# Patient Record
Sex: Female | Born: 1945 | Race: White | Hispanic: No | Marital: Married
Health system: Southern US, Community
[De-identification: ages and names within clinical notes are randomized; demographics above are authoritative.]

## PROBLEM LIST (undated history)

## (undated) DIAGNOSIS — E7800 Pure hypercholesterolemia, unspecified: Secondary | ICD-10-CM

## (undated) DIAGNOSIS — I10 Essential (primary) hypertension: Secondary | ICD-10-CM

---

## 1999-05-24 ENCOUNTER — Other Ambulatory Visit: Admission: RE | Admit: 1999-05-24 | Discharge: 1999-05-24 | Payer: Self-pay | Admitting: Family Medicine

## 1999-10-13 ENCOUNTER — Other Ambulatory Visit: Admission: RE | Admit: 1999-10-13 | Discharge: 1999-10-13 | Payer: Self-pay | Admitting: Internal Medicine

## 1999-12-26 ENCOUNTER — Encounter: Admission: RE | Admit: 1999-12-26 | Discharge: 2000-01-04 | Payer: Self-pay | Admitting: Family Medicine

## 2000-01-10 ENCOUNTER — Encounter: Payer: Self-pay | Admitting: Family Medicine

## 2000-01-10 ENCOUNTER — Encounter: Admission: RE | Admit: 2000-01-10 | Discharge: 2000-01-10 | Payer: Self-pay | Admitting: Family Medicine

## 2000-01-17 ENCOUNTER — Encounter: Admission: RE | Admit: 2000-01-17 | Discharge: 2000-01-17 | Payer: Self-pay | Admitting: Family Medicine

## 2000-01-17 ENCOUNTER — Encounter: Payer: Self-pay | Admitting: Family Medicine

## 2000-06-21 ENCOUNTER — Other Ambulatory Visit: Admission: RE | Admit: 2000-06-21 | Discharge: 2000-06-21 | Payer: Self-pay | Admitting: Family Medicine

## 2000-11-27 ENCOUNTER — Encounter: Payer: Self-pay | Admitting: Family Medicine

## 2000-11-27 ENCOUNTER — Encounter: Admission: RE | Admit: 2000-11-27 | Discharge: 2000-11-27 | Payer: Self-pay | Admitting: Family Medicine

## 2001-09-20 ENCOUNTER — Other Ambulatory Visit: Admission: RE | Admit: 2001-09-20 | Discharge: 2001-09-20 | Payer: Self-pay | Admitting: Family Medicine

## 2001-11-07 ENCOUNTER — Observation Stay (HOSPITAL_COMMUNITY): Admission: RE | Admit: 2001-11-07 | Discharge: 2001-11-07 | Payer: Self-pay | Admitting: Surgery

## 2001-11-07 ENCOUNTER — Encounter: Payer: Self-pay | Admitting: Surgery

## 2003-01-14 ENCOUNTER — Other Ambulatory Visit: Admission: RE | Admit: 2003-01-14 | Discharge: 2003-01-14 | Payer: Self-pay | Admitting: Gynecology

## 2003-07-21 ENCOUNTER — Encounter: Payer: Self-pay | Admitting: Family Medicine

## 2003-07-21 ENCOUNTER — Encounter: Admission: RE | Admit: 2003-07-21 | Discharge: 2003-07-21 | Payer: Self-pay | Admitting: Family Medicine

## 2004-02-17 ENCOUNTER — Emergency Department (HOSPITAL_COMMUNITY): Admission: EM | Admit: 2004-02-17 | Discharge: 2004-02-17 | Payer: Self-pay | Admitting: Emergency Medicine

## 2004-04-22 ENCOUNTER — Other Ambulatory Visit: Admission: RE | Admit: 2004-04-22 | Discharge: 2004-04-22 | Payer: Self-pay | Admitting: Family Medicine

## 2004-09-08 IMAGING — CT CT PELVIS W/O CM
1 series · 15 of 32 positions shown, 19 images · non-contrast
Comparison: none

CLINICAL DATA: Hematuria, nausea, abdominal pain.  Question kidney stones. 
 CT ABDOMEN AND PELVIS WITHOUT CONTRAST   
 Multidetector helical CT imaging abdomen and pelvis performed using kidney stone protocol.  Neither oral nor intravenous contrast utilized for this indication.

[Series 2: abd pelvis · axial · 0.70mm/px · z∈[-465,-155]mm · 15 of 70 slices shown, 19 images]
[im 5/70  soft-tissue]
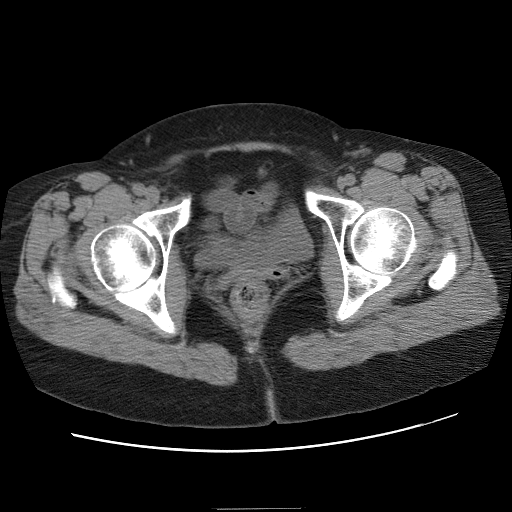
[im 5/70  bone]
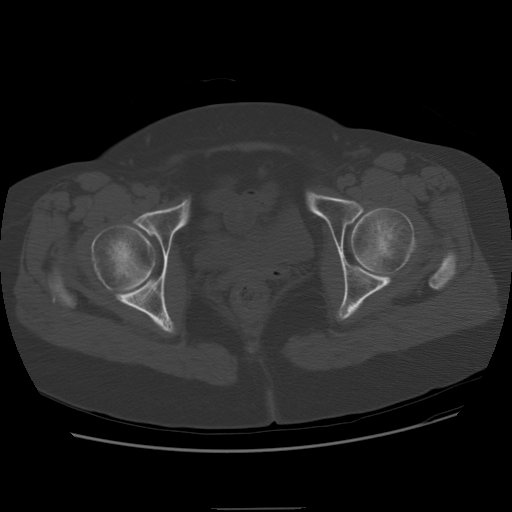
[im 9/70  soft-tissue]
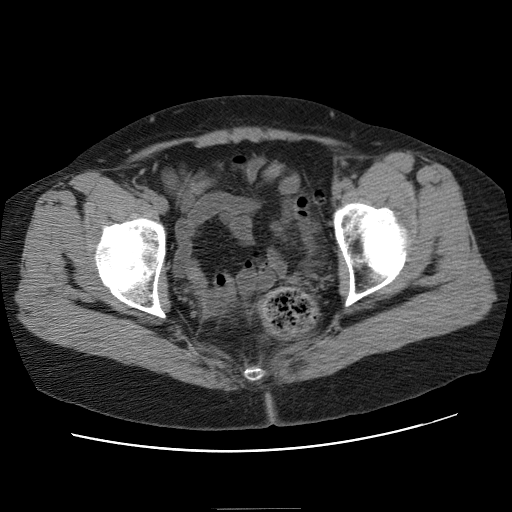
[im 14/70  soft-tissue]
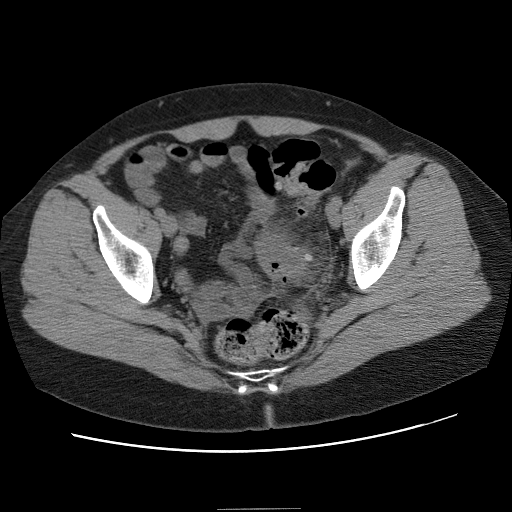
[im 21/70  soft-tissue]
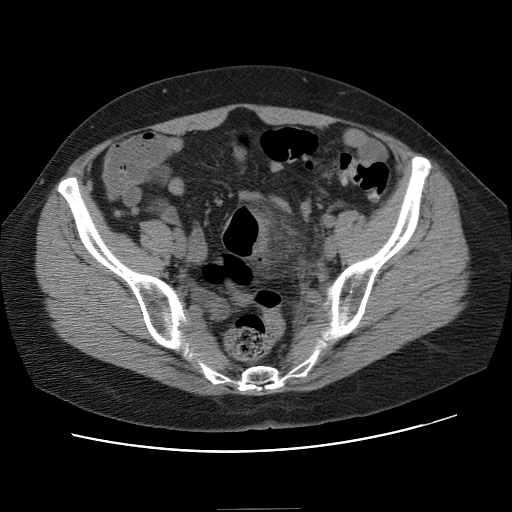
[im 25/70  soft-tissue]
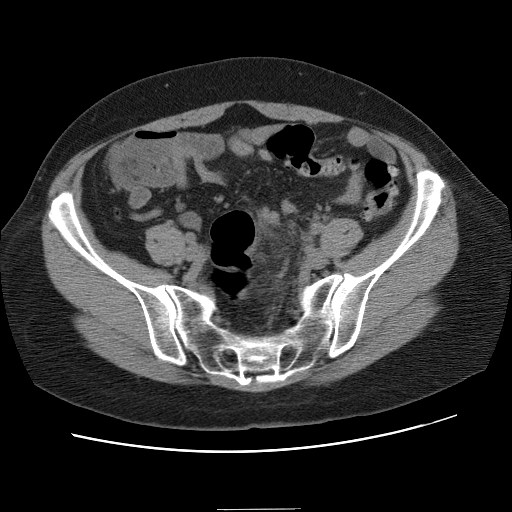
[im 29/70  soft-tissue]
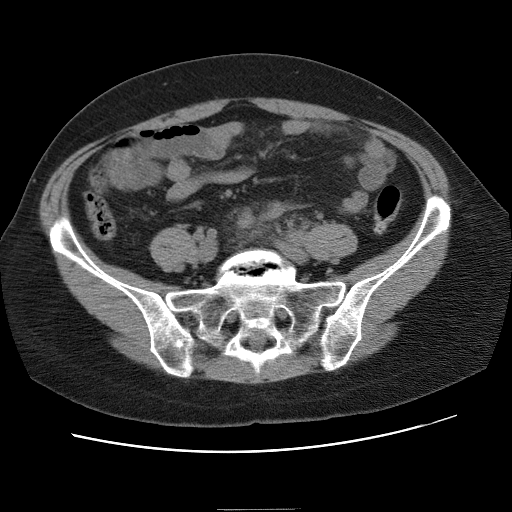
[im 36/70  soft-tissue]
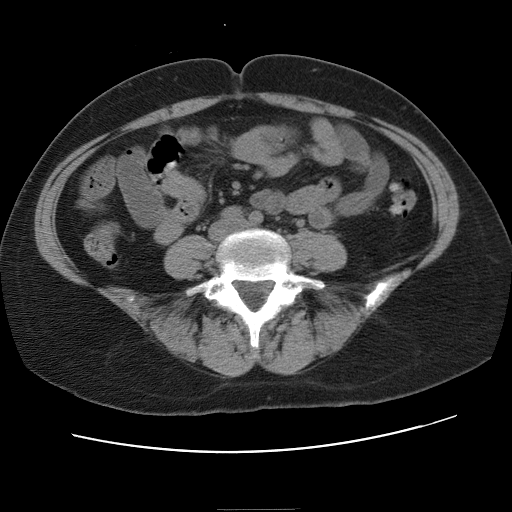
[im 41/70  soft-tissue]
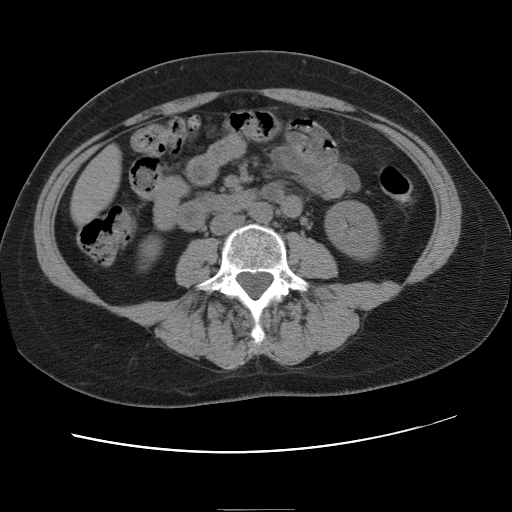
[im 45/70  soft-tissue]
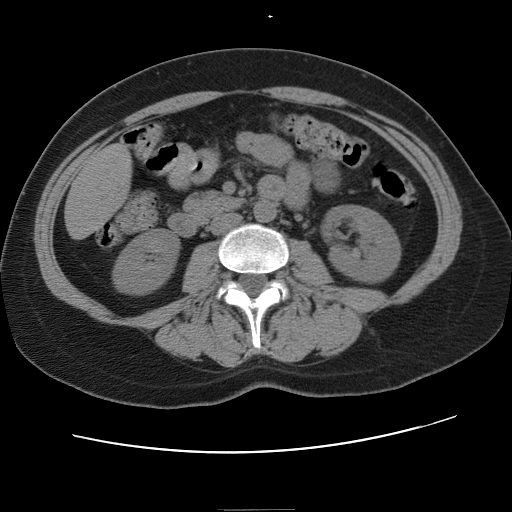
[im 45/70  bone]
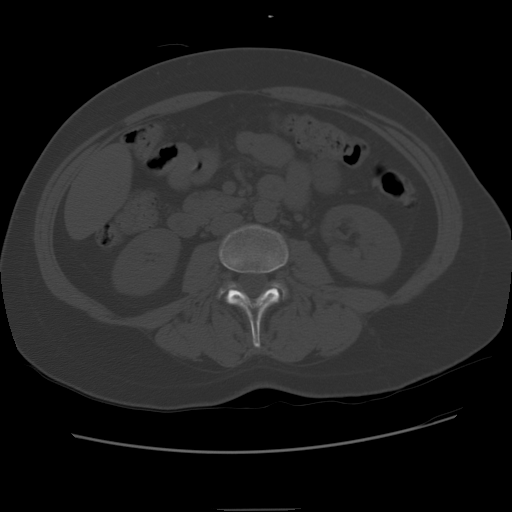
[im 49/70  soft-tissue]
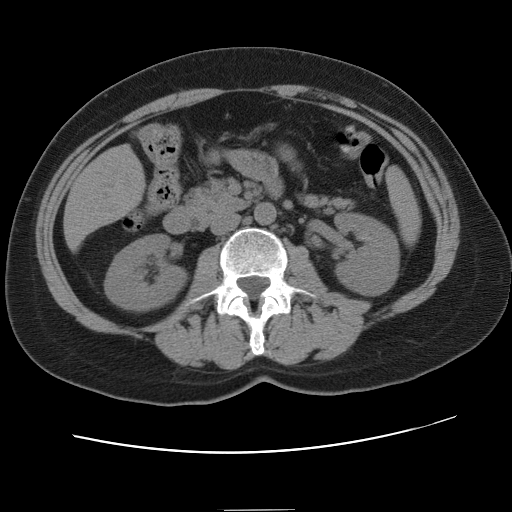
[im 56/70  soft-tissue]
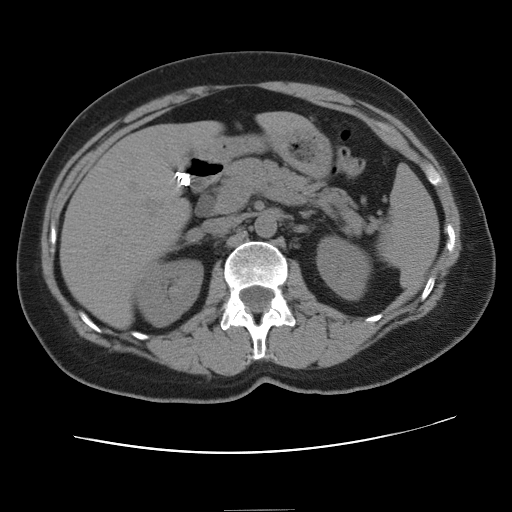
[im 61/70  soft-tissue]
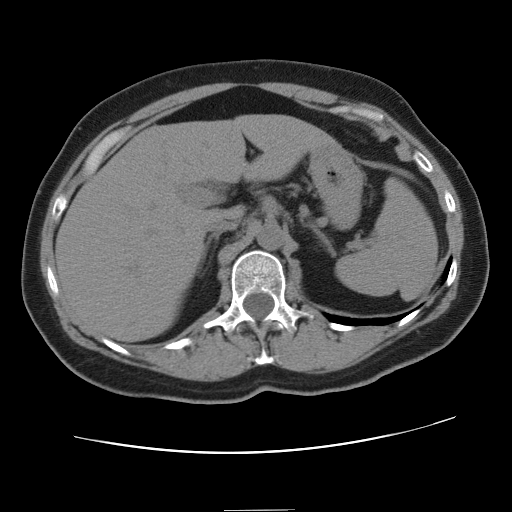
[im 61/70  lung]
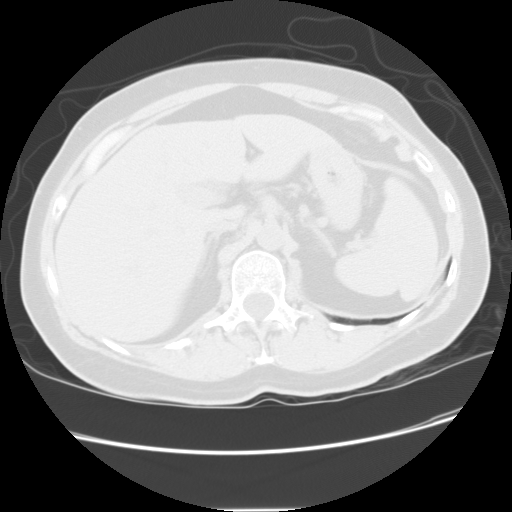
[im 63/70  lung]
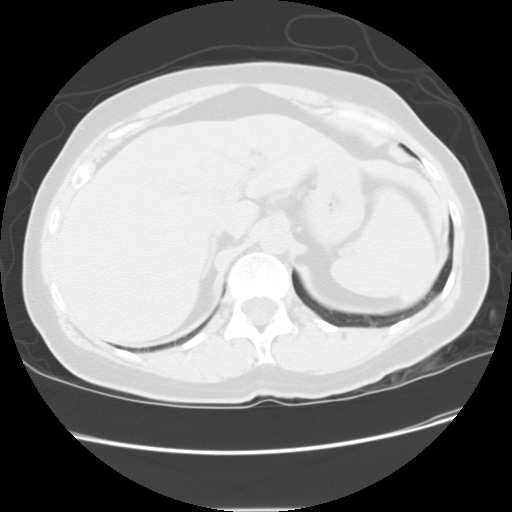
[im 65/70  soft-tissue]
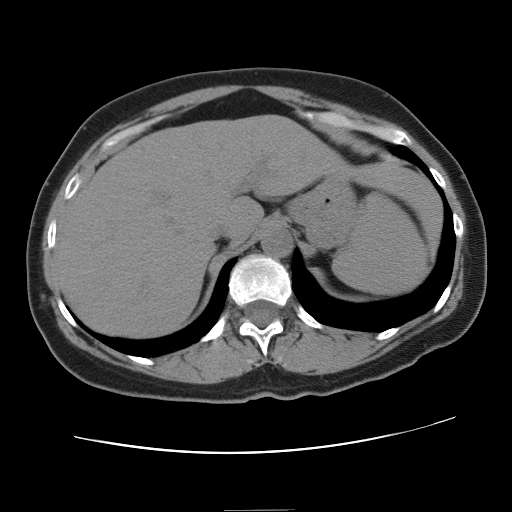
[im 65/70  lung]
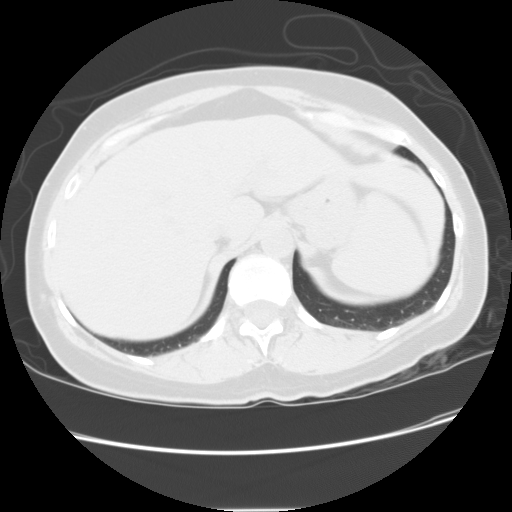
[im 67/70  lung]
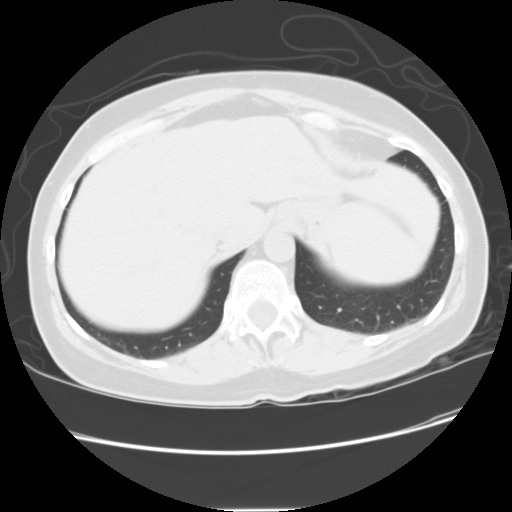

[15 of 32 positions shown; findings below may reference images not displayed]

FINDINGS: Status-post cholecystectomy.  Elongated lateral segment left lobe liver extending to left lateral abdominal wall lateral to spleen.  No hydronephrosis or renal calcification.  Low attenuation focus midright kidney, 1.4 x 1.3 cm diameter (image 18) question cyst.  Remaining solid organs and bowel loops unremarkable for exam lacking IV and oral contrast.  No mass, adenopathy, free fluid, or inflammatory process seen. 
 IMPRESSION
 No acute abnormalities.  Question right renal cyst.  This can be confirmed by ultrasound.
 CT PELVIS 
 Scattered sigmoid diverticula with segmental area of wall thickening and surrounding paracolic inflammatory changes compatible with diverticulitis.  Small amount of free pelvic fluid is seen, cannot exclude infection of this fluid.  No free intraperitoneal air identified.  No signs of bowel obstruction.  No hernia.  
 IMPRESSION
 Sigmoid diverticulitis.  Small amount of free pelvic fluid, cannot exclude infection of this fluid by computed tomography.

## 2011-04-04 ENCOUNTER — Encounter: Payer: Self-pay | Admitting: Internal Medicine

## 2014-01-29 ENCOUNTER — Encounter: Payer: Self-pay | Admitting: Internal Medicine

## 2022-01-27 ENCOUNTER — Encounter (HOSPITAL_COMMUNITY): Payer: Self-pay | Admitting: Emergency Medicine

## 2022-01-27 ENCOUNTER — Ambulatory Visit (INDEPENDENT_AMBULATORY_CARE_PROVIDER_SITE_OTHER): Payer: Medicare Other

## 2022-01-27 ENCOUNTER — Ambulatory Visit (HOSPITAL_COMMUNITY)
Admission: EM | Admit: 2022-01-27 | Discharge: 2022-01-27 | Disposition: A | Discharge status: Home / Self Care | Payer: Medicare Other | Attending: Family Medicine | Admitting: Family Medicine

## 2022-01-27 DIAGNOSIS — S52501A Unspecified fracture of the lower end of right radius, initial encounter for closed fracture: Secondary | ICD-10-CM

## 2022-01-27 DIAGNOSIS — M25531 Pain in right wrist: Secondary | ICD-10-CM

## 2022-01-27 DIAGNOSIS — W19XXXA Unspecified fall, initial encounter: Secondary | ICD-10-CM

## 2022-01-27 HISTORY — DX: Essential (primary) hypertension: I10

## 2022-01-27 HISTORY — DX: Pure hypercholesterolemia, unspecified: E78.00

## 2022-01-27 MED ORDER — IBUPROFEN 800 MG PO TABS: 800.0000 mg | ORAL_TABLET | Freq: Three times a day (TID) | ORAL | 0 refills | Status: AC | PRN

## 2022-01-27 MED ORDER — KETOROLAC TROMETHAMINE 30 MG/ML IJ SOLN
INTRAMUSCULAR | Status: AC
  Filled 2022-01-27: qty 1

## 2022-01-27 MED ORDER — KETOROLAC TROMETHAMINE 30 MG/ML IJ SOLN
30.0000 mg | Freq: Once | INTRAMUSCULAR | Status: AC
  Administered 2022-01-27: 30 mg via INTRAMUSCULAR

## 2022-01-27 NOTE — Discharge Instructions (Addendum)
There is most likely a small fracture of your radius bone.  Continue to wear your splint most the time. ? ?Have been given a shot of Toradol 30 mg here in the office ? ?Take ibuprofen 800 mg 1 every 8 hours as needed for pain ?

## 2022-01-27 NOTE — ED Triage Notes (Signed)
Pt fell off a curb Wed night. C/o right wrist pain. Pt came in wearing a brace purchased to help with stability and pain. Pt has bruising to right cheek from where hit face as well. Pt denies LOC or taking blood thinners but reports "been little foggy since".  ?

## 2022-01-27 NOTE — ED Provider Notes (Signed)
?[facility] ? ? ? ?CSN: [id number] ?Arrival date & time: 01/27/22  1829 ? ? ?  ? ?History   ?Chief Complaint ?Chief Complaint  ?Patient presents with  ? Fall  ? Wrist Pain  ? ? ?HPI ?Nicole Gardner is a 76 y.o. female.  ? ? ?Fall ? ?Wrist Pain ? ?Here for right wrist pain. Was coming out of a restaurant and fell when missed a step off a curb. Fell onto her right hand/wrist. Happened 3/15, and is still hurting.  ? ?Also struck her right cheek, not swelling there. Has felt a little off but no syncope or imbalance in her gait. ? ?Past Medical History:  ?Diagnosis Date  ? High cholesterol   ? Hypertension   ? ? ?There are no problems to display for this patient. ? ? ?History reviewed. No pertinent surgical history. ? ?OB History   ?No obstetric history on file. ?  ? ? ? ?Home Medications   ? ?Prior to Admission medications   ?Medication Sig Start Date End Date Taking? Authorizing Provider  ?atorvastatin (LIPITOR) 20 MG tablet Take 20 mg by mouth daily.   Yes [provider]  ?DULoxetine (CYMBALTA) 60 MG capsule Take 60 mg by mouth daily.   Yes [provider]  ?ibuprofen (ADVIL) 800 MG tablet Take 1 tablet (800 mg total) by mouth every 8 (eight) hours as needed (pain). 01/27/22  Yes Zenia Resides, MD  ?olmesartan (BENICAR) 40 MG tablet Take 40 mg by mouth daily.   Yes [provider]  ? ? ?Family History ?No family history on file. ? ?Social History ?  ? ? ?Allergies   ?Ciprofloxacin ? ? ?Review of Systems ?Review of Systems ? ? ?Physical Exam ?Triage Vital Signs ?ED Triage Vitals  ?Enc Vitals Group  ?   BP 01/27/22 1847 (!) 145/79  ?   Pulse Rate 01/27/22 1847 64  ?   Resp 01/27/22 1847 17  ?   Temp 01/27/22 1847 98.6 ?F (37 ?C)  ?   Temp Source 01/27/22 1847 Oral  ?   SpO2 01/27/22 1847 96 %  ?   Weight --   ?   Height --   ?   Head Circumference --   ?   Peak Flow --   ?   Pain Score 01/27/22 1839 9  ?   Pain Loc --   ?   Pain Edu? --   ?   Excl. in GC? --   ? ?No data  found. ? ?Updated Vital Signs ?BP (!) 145/79 (BP Location: Left Arm)   Pulse 64   Temp 98.6 ?F (37 ?C) (Oral)   Resp 17   SpO2 96%  ? ?Visual Acuity ?Right Eye Distance:   ?Left Eye Distance:   ?Bilateral Distance:   ? ?Right Eye Near:   ?Left Eye Near:    ?Bilateral Near:    ? ?Physical Exam ?Vitals reviewed.  ?Constitutional:   ?   General: She is not in acute distress. ?   Appearance: She is not toxic-appearing.  ?HENT:  ?   Head:  ?   Comments: Has ecchymosis on right cheek. Not puffy there. No erythema and not warm. ?   Mouth/Throat:  ?   Mouth: Mucous membranes are moist.  ?Eyes:  ?   Extraocular Movements: Extraocular movements intact.  ?   Pupils: Pupils are equal, round, and reactive to light.  ?Musculoskeletal:  ?   Comments: Has ecchymosis volar surface of  right wrist. Mild edema there  ?Skin: ?   Capillary Refill: Capillary refill takes less than 2 seconds.  ?   Coloration: Skin is not jaundiced or pale.  ?Neurological:  ?   General: No focal deficit present.  ?   Mental Status: She is alert and oriented to person, place, and time.  ?Psychiatric:     ?   Behavior: Behavior normal.  ? ? ? ?UC Treatments / Results  ?Labs ?(all labs ordered are listed, but only abnormal results are displayed) ?Labs Reviewed - No data to display ? ?EKG ? ? ?Radiology ?DG Wrist Complete Right ? ?Result Date: 01/27/2022 ?CLINICAL DATA:  Status post fall. EXAM: RIGHT WRIST - COMPLETE 3+ VIEW COMPARISON:  None. FINDINGS: A very small cortical defect of indeterminate age is seen involving the dorsal aspect of the distal right radius. There is no evidence of dislocation. There is no evidence of arthropathy or other focal bone abnormality. Mild diffuse soft tissue swelling is seen. IMPRESSION: Findings which may represent a very small fracture of indeterminate age along the dorsal aspect of the distal right radius. Correlation with physical examination is recommended to determine the presence of point tenderness within this  region. Electronically Signed   By: Aram Candela M.D.   On: 01/27/2022 19:34   ? ?Procedures ?Procedures (including critical care time) ? ?Medications Ordered in UC ?Medications  ?ketorolac (TORADOL) 30 MG/ML injection 30 mg (has no administration in time range)  ? ? ?Initial Impression / Assessment and Plan / UC Course  ?I have reviewed the triage vital signs and the nursing notes. ? ?Pertinent labs & imaging results that were available during my care of the patient were reviewed by me and considered in my medical decision making (see chart for details). ? ?  ? ?Has a prob small fracture of the dorsum of the radius. She is from [place], and they leave in the morning to drive back. We will make a disc of her xrays, and provide pain relief. ?Final Clinical Impressions(s) / UC Diagnoses  ? ?Final diagnoses:  ?Right wrist pain  ?Closed fracture of distal end of right radius, unspecified fracture morphology, initial encounter  ? ? ? ?Discharge Instructions   ? ?  ?There is most likely a small fracture of your radius bone.  Continue to wear your splint most the time. ? ?Have been given a shot of Toradol 30 mg here in the office ? ?Take ibuprofen 800 mg 1 every 8 hours as needed for pain ? ? ? ? ?ED Prescriptions   ? ? Medication Sig Dispense Auth. Provider  ? ibuprofen (ADVIL) 800 MG tablet Take 1 tablet (800 mg total) by mouth every 8 (eight) hours as needed (pain). 21 tablet Marlinda Mike Janace Aris, MD  ? ?  ? ?PDMP not reviewed this encounter. ?  ?Zenia Resides, MD ?01/27/22 1945 ? ?
